# Patient Record
Sex: Male | Born: 1970
Health system: Southern US, Community
[De-identification: ages and names within clinical notes are randomized; demographics above are authoritative.]

## PROBLEM LIST (undated history)

## (undated) DIAGNOSIS — K219 Gastro-esophageal reflux disease without esophagitis: Secondary | ICD-10-CM

---

## 2002-01-15 ENCOUNTER — Encounter: Payer: Self-pay | Admitting: Emergency Medicine

## 2002-01-16 ENCOUNTER — Inpatient Hospital Stay (HOSPITAL_COMMUNITY): Admission: EM | Admit: 2002-01-16 | Discharge: 2002-01-18 | Payer: Self-pay | Admitting: Emergency Medicine

## 2002-01-17 ENCOUNTER — Encounter: Payer: Self-pay | Admitting: Emergency Medicine

## 2014-01-07 ENCOUNTER — Other Ambulatory Visit (HOSPITAL_COMMUNITY): Payer: Self-pay | Admitting: Internal Medicine

## 2014-01-07 DIAGNOSIS — R1310 Dysphagia, unspecified: Secondary | ICD-10-CM

## 2014-01-07 DIAGNOSIS — R1013 Epigastric pain: Secondary | ICD-10-CM

## 2014-01-07 DIAGNOSIS — R110 Nausea: Secondary | ICD-10-CM

## 2014-01-17 ENCOUNTER — Ambulatory Visit (HOSPITAL_COMMUNITY)
Admission: RE | Admit: 2014-01-17 | Discharge: 2014-01-17 | Disposition: A | Discharge status: Home / Self Care | Payer: Medicare Other | Source: Ambulatory Visit | Attending: Internal Medicine | Admitting: Internal Medicine

## 2014-01-17 DIAGNOSIS — R1310 Dysphagia, unspecified: Secondary | ICD-10-CM

## 2014-01-17 DIAGNOSIS — R110 Nausea: Secondary | ICD-10-CM

## 2014-01-17 DIAGNOSIS — R1013 Epigastric pain: Secondary | ICD-10-CM

## 2014-01-17 DIAGNOSIS — F458 Other somatoform disorders: Secondary | ICD-10-CM | POA: Insufficient documentation

## 2014-01-17 DIAGNOSIS — K219 Gastro-esophageal reflux disease without esophagitis: Secondary | ICD-10-CM | POA: Insufficient documentation

## 2014-01-17 DIAGNOSIS — Z87820 Personal history of traumatic brain injury: Secondary | ICD-10-CM | POA: Insufficient documentation
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
## 2016-08-27 ENCOUNTER — Other Ambulatory Visit (HOSPITAL_COMMUNITY): Payer: Self-pay | Admitting: Internal Medicine

## 2016-08-27 ENCOUNTER — Ambulatory Visit (HOSPITAL_COMMUNITY)
Admission: RE | Admit: 2016-08-27 | Discharge: 2016-08-27 | Disposition: A | Discharge status: Home / Self Care | Payer: Medicare Other | Source: Ambulatory Visit | Attending: Internal Medicine | Admitting: Internal Medicine

## 2016-08-27 DIAGNOSIS — R059 Cough, unspecified: Secondary | ICD-10-CM

## 2016-08-27 DIAGNOSIS — R05 Cough: Secondary | ICD-10-CM

## 2016-08-27 DIAGNOSIS — J42 Unspecified chronic bronchitis: Secondary | ICD-10-CM | POA: Insufficient documentation

## 2016-08-27 DIAGNOSIS — Z720 Tobacco use: Secondary | ICD-10-CM | POA: Diagnosis not present

## 2017-08-18 ENCOUNTER — Other Ambulatory Visit (HOSPITAL_COMMUNITY): Payer: Self-pay | Admitting: Internal Medicine

## 2017-08-18 ENCOUNTER — Ambulatory Visit (HOSPITAL_COMMUNITY)
Admission: RE | Admit: 2017-08-18 | Discharge: 2017-08-18 | Disposition: A | Discharge status: Home / Self Care | Payer: Medicare Other | Source: Ambulatory Visit | Attending: Internal Medicine | Admitting: Internal Medicine

## 2017-08-18 DIAGNOSIS — M25511 Pain in right shoulder: Secondary | ICD-10-CM | POA: Insufficient documentation

## 2019-04-19 IMAGING — CR DG SHOULDER 2+V*R*
3 series · 3 of 3 positions shown · non-contrast
Comparison: Chest x-ray of August 27, 2016

CLINICAL DATA: Right shoulder pain of uncertain duration. No
definite injury.

EXAM:
RIGHT SHOULDER - 2+ VIEW

[shoulder grashey]
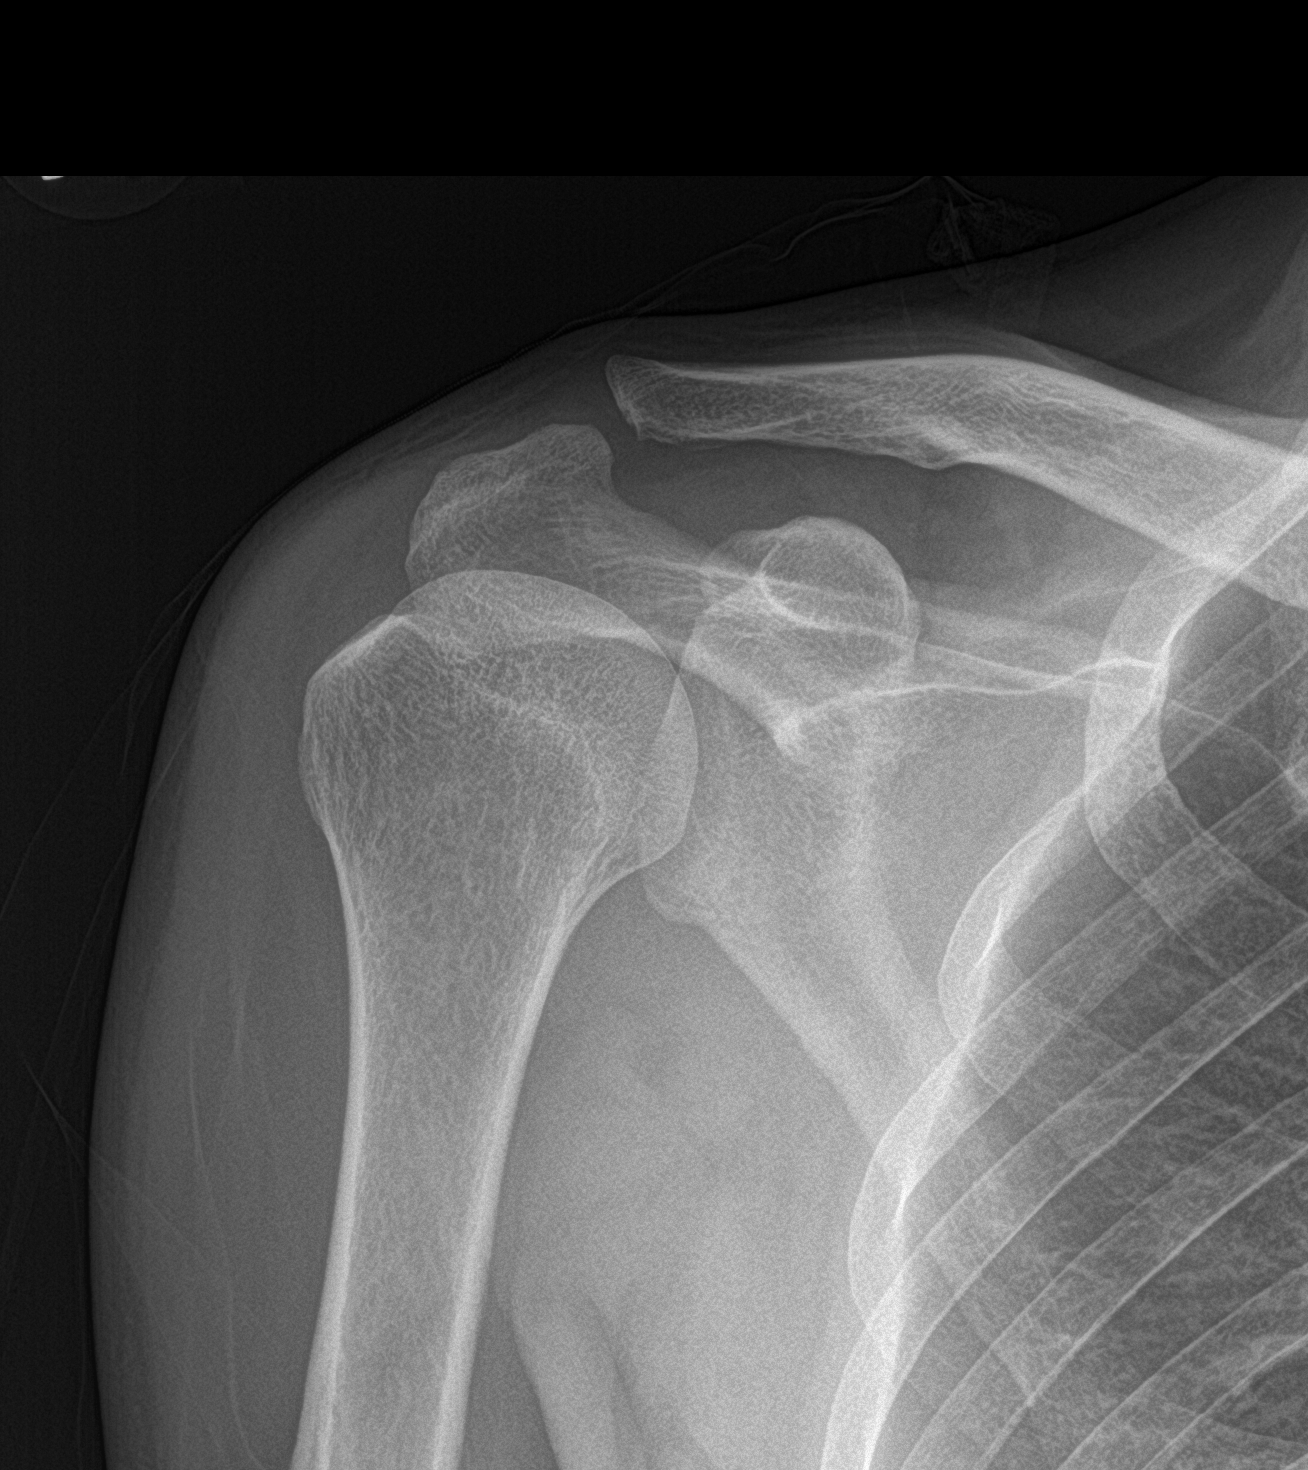

[shoulder y view]
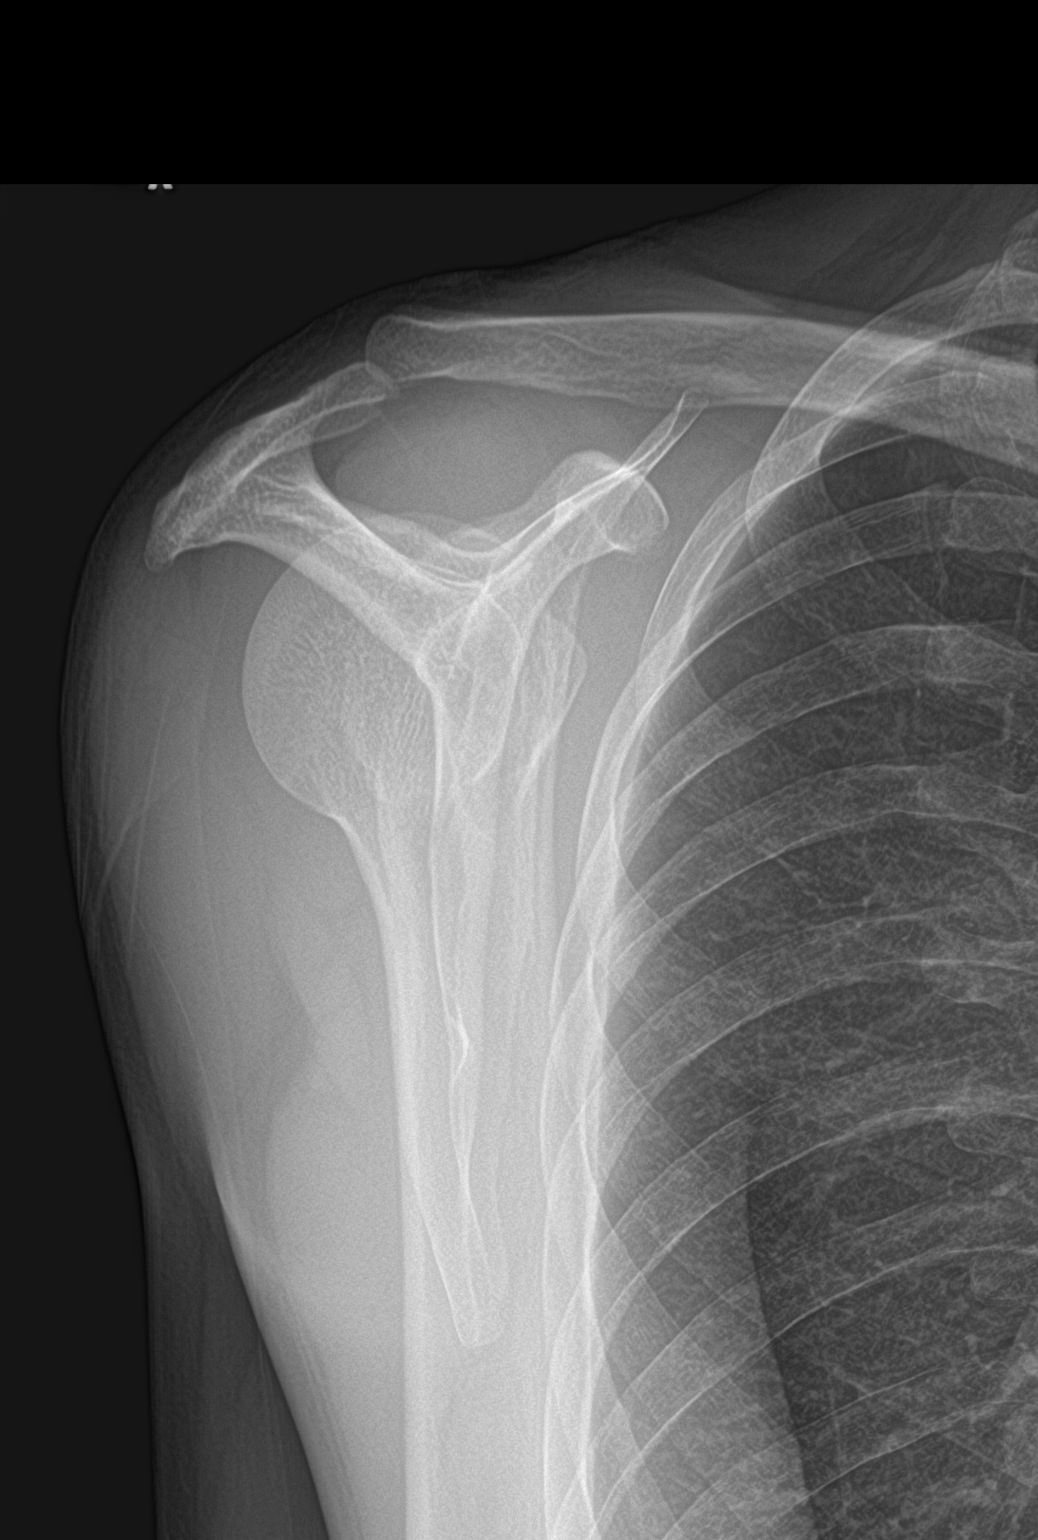

[shoulder axillary]
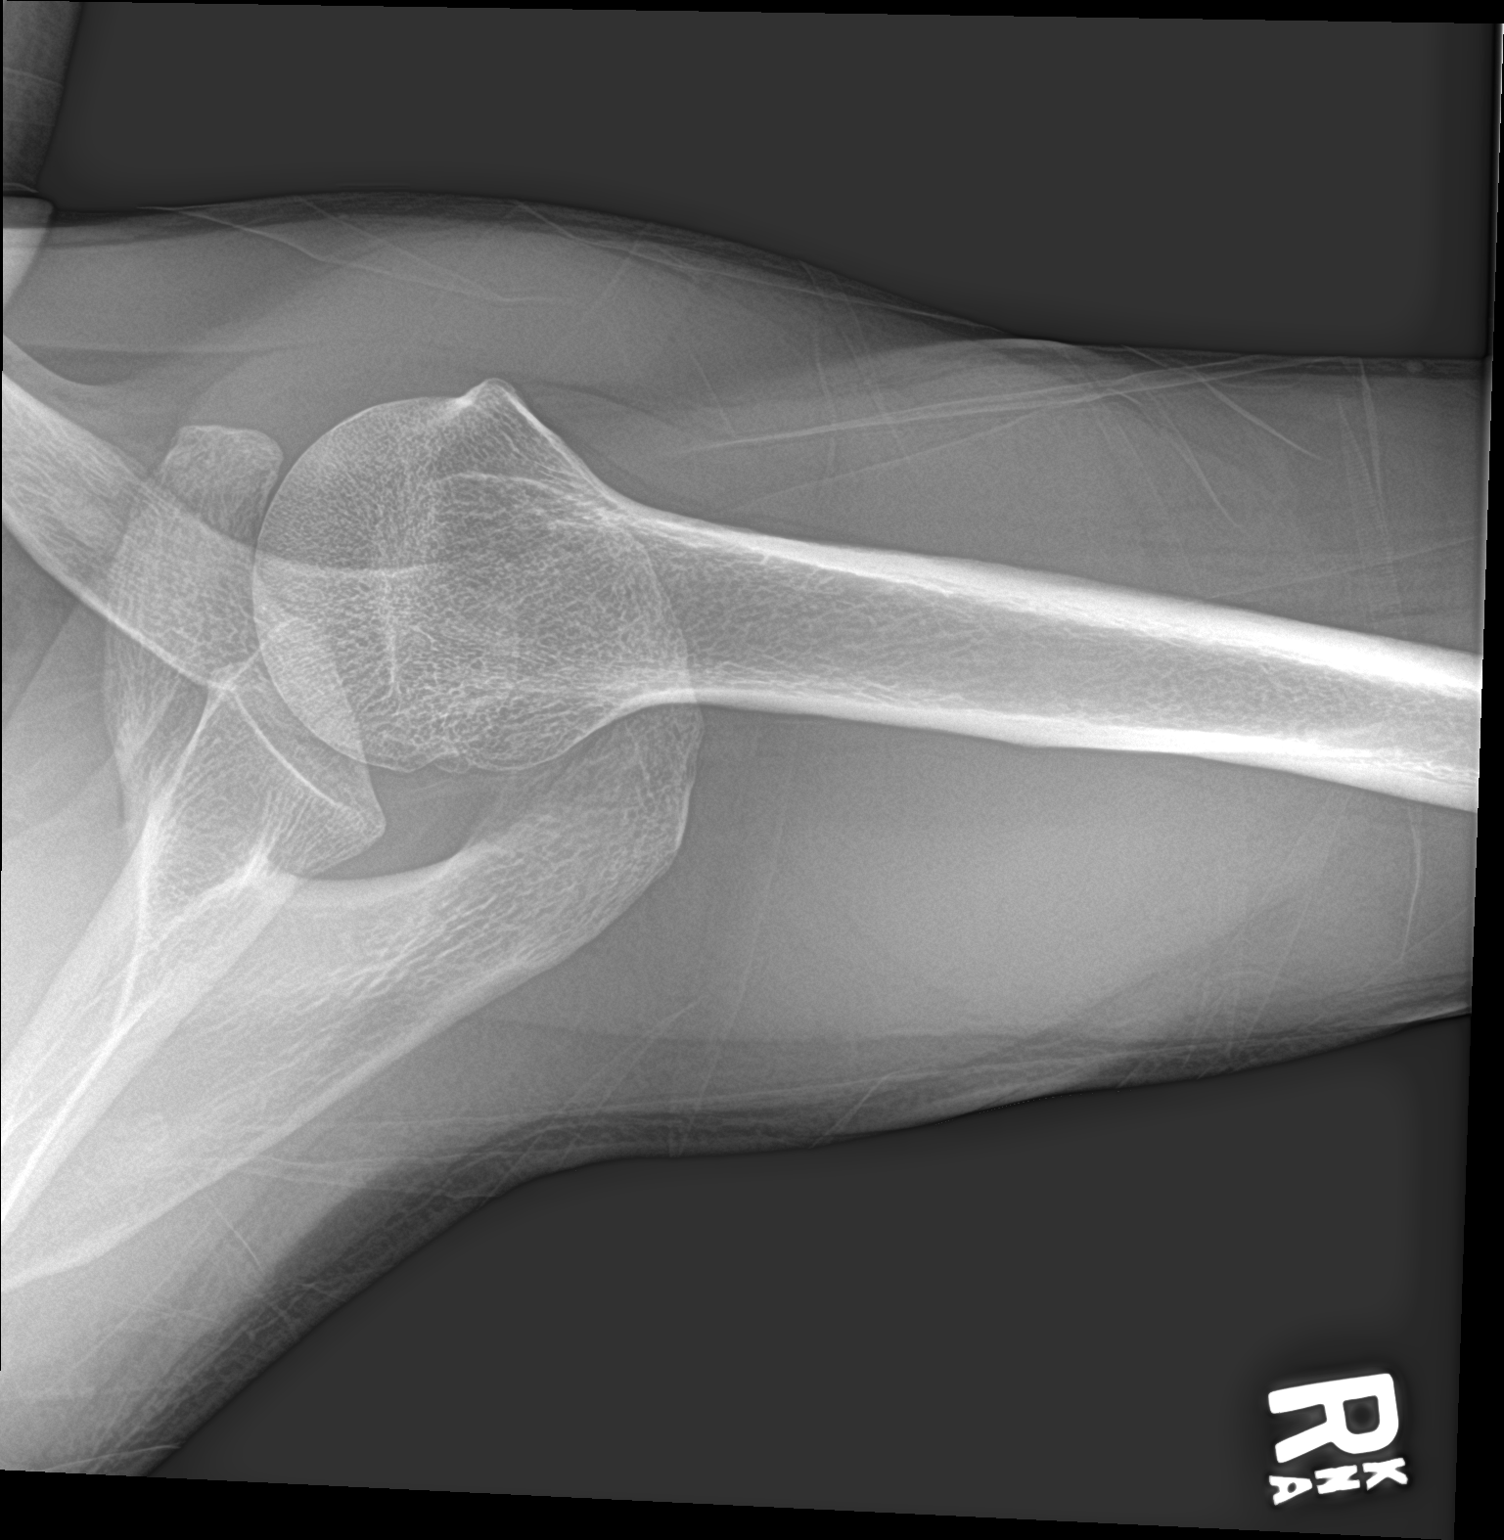

[3 of 3 positions shown; findings below may reference images not displayed]

FINDINGS: The bones are subjectively adequately mineralized. The joint spaces
are well maintained. No acute or old fracture is observed. These
soft tissues are unremarkable.
IMPRESSION: There is no acute or significant chronic bony abnormality of the
right shoulder. If there are symptoms referrable to the AC joint, an
AC joint series would be useful. Otherwise the most useful next
imaging step would be MRI.

## 2020-07-17 ENCOUNTER — Other Ambulatory Visit: Payer: Self-pay

## 2020-07-17 ENCOUNTER — Emergency Department (HOSPITAL_COMMUNITY)
Admission: EM | Admit: 2020-07-17 | Discharge: 2020-07-17 | Disposition: A | Discharge status: Home / Self Care | Payer: Medicare HMO | Attending: Emergency Medicine | Admitting: Emergency Medicine

## 2020-07-17 ENCOUNTER — Encounter (HOSPITAL_COMMUNITY): Payer: Self-pay

## 2020-07-17 DIAGNOSIS — R69 Illness, unspecified: Secondary | ICD-10-CM | POA: Diagnosis not present

## 2020-07-17 DIAGNOSIS — R21 Rash and other nonspecific skin eruption: Secondary | ICD-10-CM | POA: Diagnosis not present

## 2020-07-17 DIAGNOSIS — L720 Epidermal cyst: Secondary | ICD-10-CM | POA: Insufficient documentation

## 2020-07-17 DIAGNOSIS — F17210 Nicotine dependence, cigarettes, uncomplicated: Secondary | ICD-10-CM | POA: Diagnosis not present

## 2020-07-17 NOTE — ED Triage Notes (Signed)
Patient has a rash on his back and several hard raised areas on the back and right neck x "years."

## 2020-07-17 NOTE — ED Provider Notes (Signed)
[facility]PT Provider Note   CSN: [id number] Arrival date & time: 07/17/20  0946    History Chief Complaint  Patient presents with  . Rash    Chad Nunez is a 49 y.o. Nunez with past medical history who presents for evaluation of rash.  Patient states he has had a raised, rounded lesions to his right lateral neck as well as his posterior back times years.  States he has have never been assessed.  Area is nontender to palpation.  No overlying erythema or warmth.  Have gradually grown in size.  No pruritus.  Denies headache, lightness, dizziness, chest pain, shortness breath abdominal pain, diarrhea, dysuria.  Denies aggravating or alleviating factors.  No new lotions or perfumes.  No sensation of throat closing, oral swelling.  History obtained from patient and past medical records. Medical [place] interpreter used.  HPI     History reviewed. No pertinent past medical history.  There are no problems to display for this patient.   History reviewed. No pertinent surgical history.     Family History  Family history unknown: Yes    Social History   Tobacco Use  . Smoking status: Current Every Day Smoker    Packs/day: 0.50    Types: Cigarettes  . Smokeless tobacco: Never Used  Vaping Use  . Vaping Use: Never used  Substance Use Topics  . Alcohol use: Yes  . Drug use: Never    Home Medications Prior to Admission medications   Not on File    Allergies    Patient has no known allergies.  Review of Systems   Review of Systems  Constitutional: Negative.   HENT: Negative.   Respiratory: Negative.   Cardiovascular: Negative.   Gastrointestinal: Negative.   Genitourinary: Negative.   Musculoskeletal: Negative.   Skin: Positive for rash. Negative for color change, pallor and wound.  Neurological: Negative.   All other systems reviewed and are negative.   Physical Exam Updated Vital Signs BP 128/60 (BP Location: Left Arm)    Pulse 70   Temp 98.9 F (37.2 C) (Oral)   Resp (!) 21   Ht 5\' 3"  (1.6 m)   Wt 49.9 kg   SpO2 95%   BMI 19.49 kg/m   Physical Exam Vitals and nursing note reviewed.  Constitutional:      General: He is not in acute distress.    Appearance: He is well-developed. He is not ill-appearing, toxic-appearing or diaphoretic.  HENT:     Head: Normocephalic and atraumatic.     Mouth/Throat:     Mouth: Mucous membranes are moist.  Eyes:     Pupils: Pupils are equal, round, and reactive to light.  Neck:     Trachea: Trachea and phonation normal.      Comments: No cervical lymphadenopathy Cardiovascular:     Rate and Rhythm: Normal rate and regular rhythm.     Pulses: Normal pulses.     Heart sounds: Normal heart sounds.  Pulmonary:     Effort: Pulmonary effort is normal. No respiratory distress.     Breath sounds: Normal breath sounds.  Abdominal:     General: Bowel sounds are normal. There is no distension.     Palpations: Abdomen is soft.  Musculoskeletal:        General: Normal range of motion.     Cervical back: Full passive range of motion without pain, normal range of motion and neck supple.  Lymphadenopathy:     Cervical: No  cervical adenopathy.  Skin:    General: Skin is warm and dry.     Capillary Refill: Capillary refill takes less than 2 seconds.     Comments: Multiple firm, mobile, nontender circular lesions to posterior back ranging from 61mm to 1.4 cm in diameter as well as single 1 cm rounded area to right lateral posterior neck.  Not consistent with lymphadenopathy.  No overlying edema, erythema or warmth.  Nontender.  Areas consistent with cyst. No fluctuance or induration.  Neurological:     General: No focal deficit present.     Mental Status: He is alert and oriented to person, place, and time.     ED Results / Procedures / Treatments   Labs (all labs ordered are listed, but only abnormal results are displayed) Labs Reviewed - No data to  display  EKG None  Radiology No results found.  Procedures Procedures (including critical care time)  Medications Ordered in ED Medications - No data to display  ED Course  I have reviewed the triage vital signs and the nursing notes.  Pertinent labs & imaging results that were available during my care of the patient were reviewed by me and considered in my medical decision making (see chart for details).  Chad Nunez presents for evaluation of rash.  He is afebrile, nonseptic, non-ill-appearing.  Patient with multiple firm, mobile, nontender circular lesions consistent with cysts over his right posterior back ranging in size from 8 mm to 1.4 cm in diameter.  Patient also with number one 1 cm rounded area consistent with likely cyst to right lateral posterior neck.  No overlying edema, erythema or warmth.  No fluctuance or induration.  Symptoms have been consistent times "years."  No overlying skin changes to suggest infectious process, abscess.  Will refer outpatient for evaluation.  Patient denies any difficulty breathing or swallowing.  Pt has a patent airway without stridor and is handling secretions without difficulty; no angioedema. No blisters, no pustules, no warmth, no draining sinus tracts, no superficial abscesses, no bullous impetigo, no vesicles, no desquamation, no target lesions with dusky purpura or a central bulla. Not tender to touch. No concern for superimposed infection. No concern for SJS, TEN, TSS, tick borne illness, syphilis or other life-threatening condition.   The patient has been appropriately medically screened and/or stabilized in the ED. I have low suspicion for any other emergent medical condition which would require further screening, evaluation or treatment in the ED or require inpatient management.  Patient is hemodynamically stable and in no acute distress.  Patient able to ambulate in department prior to ED.  Evaluation does not show acute pathology that  would require ongoing or additional emergent interventions while in the emergency department or further inpatient treatment.  I have discussed the diagnosis with the patient and answered all questions.  Pain is been managed while in the emergency department and patient has no further complaints prior to discharge.  Patient is comfortable with plan discussed in room and is stable for discharge at this time.  I have discussed strict return precautions for returning to the emergency department.  Patient was encouraged to follow-up with PCP/specialist refer to at discharge.    MDM Rules/Calculators/A&P                           Final Clinical Impression(s) / ED Diagnoses Final diagnoses:  Epidermoid cyst    Rx / DC Orders ED Discharge Orders  None       Baylea Milburn A, PA-C 07/17/20 1657    Sabino Donovan, MD 07/17/20 2225

## 2020-07-17 NOTE — Discharge Instructions (Signed)
Follow up with surgery.  Return for new or worsening symptoms

## 2020-08-10 ENCOUNTER — Ambulatory Visit: Payer: Self-pay | Admitting: General Surgery

## 2020-08-10 DIAGNOSIS — L729 Follicular cyst of the skin and subcutaneous tissue, unspecified: Secondary | ICD-10-CM | POA: Diagnosis not present

## 2020-08-10 NOTE — H&P (Signed)
  History of Present Illness Chad Filler MD; 08/10/2020 10:04 AM) The patient is a 49 year old male who presents with a complaint of cyst. Patient is a 49 year old male, who comes in with his mother who is his Nurse, learning disability. The patient speaks the knees. Patient was recently seen in the ER secondary to back cyst. Patient's had previous cysts in the past. According to his mother patient has had decrease in size and itching to the cysts.     Allergies (Tanisha A. Manson Passey, RMA; 08/10/2020 9:54 AM) No Known Drug Allergies  [08/10/2020]: Allergies Reconciled   Medication History (Tanisha A. Manson Passey, RMA; 08/10/2020 9:54 AM) No Current Medications Medications Reconciled    Review of Systems Chad Filler, MD; 08/10/2020 10:6 AM) All other systems negative  Vitals (Tanisha A. Brown RMA; 08/10/2020 9:54 AM) 08/10/2020 9:54 AM Weight: 109.4 lb Height: 66in Body Surface Area: 1.55 m Body Mass Index: 17.66 kg/m  Temp.: 98.31F  Pulse: 83 (Regular)  BP: 118/82(Sitting, Left Arm, Standard)       Physical Exam Chad Filler MD; 08/10/2020 10:05 AM) The physical exam findings are as follows: Note: Constitutional: No acute distress, conversant, appears stated age  Eyes: Anicteric sclerae, moist conjunctiva, no lid lag  Neck: No thyromegaly, trachea midline, no cervical lymphadenopathy  Lungs: Clear to auscultation biilaterally, normal respiratory effot  Cardiovascular: regular rate & rhythm, no murmurs, no peripheal edema, pedal pulses 2+  GI: Soft, no masses or hepatosplenomegaly, non-tender to palpation  MSK: Normal gait, no clubbing cyanosis, edema  Skin: No rashes, palpation reveals normal skin turgor, access 3, approximately 2 cm in size, subcutaneous, right lateral neck cyst, approximately 2 cm in size, nonerythematous  Psychiatric: Appropriate judgment and insight, oriented to person, place, and time    Assessment & Plan Chad Filler MD; 08/10/2020  10:06 AM) CYST OF SKIN (L72.9) Impression: Patient is a 49 year old male with multiple back cyst 3 as well as right neck cyst.  1. We will proceed to the operating room for excision of cysts  2. I discussed with the patient the risks and benefits of the procedure to include but not limited to: Infection, bleeding, damage to structures, possible recurrence, and possible skin dehiscence. The patient was understanding and wishes to proceed.

## 2020-10-23 ENCOUNTER — Other Ambulatory Visit (HOSPITAL_COMMUNITY): Payer: Medicare Other

## 2020-10-25 ENCOUNTER — Other Ambulatory Visit: Payer: Self-pay

## 2020-10-25 ENCOUNTER — Encounter (HOSPITAL_COMMUNITY): Payer: Self-pay | Admitting: General Surgery

## 2020-10-25 NOTE — Progress Notes (Signed)
Patient speaks [place] spoke with patient's Mother Marquis Buggy for PAT information..  Used [employer], [place] # [phone].  PCP - None Cardiologist - n/a  Chest x-ray - n/a EKG - n/a Stress Test - n/a ECHO - n/a Cardiac Cath - n/a  STOP now taking any Aspirin (unless otherwise instructed by your surgeon), Aleve, Naproxen, Ibuprofen, Motrin, Advil, Goody's, BC's, all herbal medications, fish oil, and all vitamins.   Coronavirus Screening Covid test on DOS. Do you have any of the following symptoms:  Cough yes/no: No Fever (>100.33F)  yes/no: No Runny nose yes/no: No Sore throat yes/no: No Difficulty breathing/shortness of breath  yes/no: No  Have you traveled in the last 14 days and where? yes/no: No  Patient's Mother Michaiah Holsopple verbalized understanding of instructions that were given via phone.

## 2020-10-26 ENCOUNTER — Encounter (HOSPITAL_COMMUNITY)
Admission: RE | Disposition: A | Discharge status: Home / Self Care | Payer: Self-pay | Source: Home / Self Care | Attending: General Surgery

## 2020-10-26 ENCOUNTER — Ambulatory Visit (HOSPITAL_COMMUNITY)
Admission: RE | Admit: 2020-10-26 | Discharge: 2020-10-26 | Disposition: A | Discharge status: Home / Self Care | Payer: Medicare HMO | Attending: General Surgery | Admitting: General Surgery

## 2020-10-26 ENCOUNTER — Ambulatory Visit (HOSPITAL_COMMUNITY): Payer: Medicare HMO | Admitting: Anesthesiology

## 2020-10-26 ENCOUNTER — Encounter (HOSPITAL_COMMUNITY): Payer: Self-pay | Admitting: General Surgery

## 2020-10-26 DIAGNOSIS — K219 Gastro-esophageal reflux disease without esophagitis: Secondary | ICD-10-CM | POA: Diagnosis not present

## 2020-10-26 DIAGNOSIS — R69 Illness, unspecified: Secondary | ICD-10-CM | POA: Diagnosis not present

## 2020-10-26 DIAGNOSIS — L720 Epidermal cyst: Secondary | ICD-10-CM | POA: Diagnosis not present

## 2020-10-26 DIAGNOSIS — F17200 Nicotine dependence, unspecified, uncomplicated: Secondary | ICD-10-CM | POA: Diagnosis not present

## 2020-10-26 DIAGNOSIS — L729 Follicular cyst of the skin and subcutaneous tissue, unspecified: Secondary | ICD-10-CM | POA: Diagnosis not present

## 2020-10-26 DIAGNOSIS — Z20822 Contact with and (suspected) exposure to covid-19: Secondary | ICD-10-CM | POA: Insufficient documentation

## 2020-10-26 HISTORY — PX: CYST EXCISION: SHX5701

## 2020-10-26 HISTORY — DX: Gastro-esophageal reflux disease without esophagitis: K21.9

## 2020-10-26 LAB — SARS CORONAVIRUS 2 BY RT PCR (HOSPITAL ORDER, PERFORMED IN ~~LOC~~ HOSPITAL LAB): SARS Coronavirus 2: NEGATIVE

## 2020-10-26 SURGERY — CYST REMOVAL
Anesthesia: General

## 2020-10-26 MED ORDER — OXYCODONE HCL 5 MG/5ML PO SOLN: 5.0000 mg | Freq: Once | ORAL | Status: DC | PRN

## 2020-10-26 MED ORDER — PROPOFOL 10 MG/ML IV BOLUS
INTRAVENOUS | Status: AC
  Filled 2020-10-26: qty 20

## 2020-10-26 MED ORDER — ONDANSETRON HCL 4 MG/2ML IJ SOLN
INTRAMUSCULAR | Status: DC | PRN
  Administered 2020-10-26: 4 mg via INTRAVENOUS

## 2020-10-26 MED ORDER — SUGAMMADEX SODIUM 200 MG/2ML IV SOLN
INTRAVENOUS | Status: DC | PRN
  Administered 2020-10-26: 200 mg via INTRAVENOUS

## 2020-10-26 MED ORDER — 0.9 % SODIUM CHLORIDE (POUR BTL) OPTIME
TOPICAL | Status: DC | PRN
  Administered 2020-10-26: 1000 mL

## 2020-10-26 MED ORDER — ACETAMINOPHEN 160 MG/5ML PO SOLN: 1000.0000 mg | Freq: Once | ORAL | Status: DC | PRN

## 2020-10-26 MED ORDER — ENSURE PRE-SURGERY PO LIQD: 296.0000 mL | Freq: Once | ORAL | Status: DC

## 2020-10-26 MED ORDER — DEXAMETHASONE SODIUM PHOSPHATE 10 MG/ML IJ SOLN
INTRAMUSCULAR | Status: DC | PRN
  Administered 2020-10-26: 10 mg via INTRAVENOUS

## 2020-10-26 MED ORDER — ORAL CARE MOUTH RINSE: 15.0000 mL | Freq: Once | OROMUCOSAL | Status: AC

## 2020-10-26 MED ORDER — ROCURONIUM BROMIDE 10 MG/ML (PF) SYRINGE
PREFILLED_SYRINGE | INTRAVENOUS | Status: DC | PRN
  Administered 2020-10-26: 40 mg via INTRAVENOUS

## 2020-10-26 MED ORDER — CEFAZOLIN SODIUM-DEXTROSE 2-4 GM/100ML-% IV SOLN
2.0000 g | INTRAVENOUS | Status: AC
  Administered 2020-10-26: 2 g via INTRAVENOUS
  Filled 2020-10-26: qty 100

## 2020-10-26 MED ORDER — CHLORHEXIDINE GLUCONATE CLOTH 2 % EX PADS: 6.0000 | MEDICATED_PAD | Freq: Once | CUTANEOUS | Status: DC

## 2020-10-26 MED ORDER — OXYCODONE HCL 5 MG PO TABS: 5.0000 mg | ORAL_TABLET | Freq: Once | ORAL | Status: DC | PRN

## 2020-10-26 MED ORDER — PROPOFOL 10 MG/ML IV BOLUS
INTRAVENOUS | Status: DC | PRN
  Administered 2020-10-26: 20 mg via INTRAVENOUS
  Administered 2020-10-26: 30 mg via INTRAVENOUS
  Administered 2020-10-26: 90 mg via INTRAVENOUS

## 2020-10-26 MED ORDER — TRAMADOL HCL 50 MG PO TABS
50.0000 mg | ORAL_TABLET | Freq: Four times a day (QID) | ORAL | 0 refills | Status: AC | PRN
Start: 2020-10-26 — End: 2021-10-26

## 2020-10-26 MED ORDER — ACETAMINOPHEN 500 MG PO TABS
1000.0000 mg | ORAL_TABLET | ORAL | Status: AC
  Administered 2020-10-26: 1000 mg via ORAL
  Filled 2020-10-26: qty 2

## 2020-10-26 MED ORDER — LIDOCAINE 2% (20 MG/ML) 5 ML SYRINGE
INTRAMUSCULAR | Status: DC | PRN
  Administered 2020-10-26: 40 mg via INTRAVENOUS

## 2020-10-26 MED ORDER — FENTANYL CITRATE (PF) 250 MCG/5ML IJ SOLN
INTRAMUSCULAR | Status: DC | PRN
  Administered 2020-10-26: 100 ug via INTRAVENOUS

## 2020-10-26 MED ORDER — ACETAMINOPHEN 10 MG/ML IV SOLN: 1000.0000 mg | Freq: Once | INTRAVENOUS | Status: DC | PRN

## 2020-10-26 MED ORDER — FENTANYL CITRATE (PF) 100 MCG/2ML IJ SOLN: 25.0000 ug | INTRAMUSCULAR | Status: DC | PRN

## 2020-10-26 MED ORDER — CHLORHEXIDINE GLUCONATE 0.12 % MT SOLN
15.0000 mL | Freq: Once | OROMUCOSAL | Status: AC
  Administered 2020-10-26: 15 mL via OROMUCOSAL
  Filled 2020-10-26: qty 15

## 2020-10-26 MED ORDER — BUPIVACAINE HCL (PF) 0.25 % IJ SOLN: INTRAMUSCULAR | Status: DC | PRN

## 2020-10-26 MED ORDER — LACTATED RINGERS IV SOLN: INTRAVENOUS | Status: DC | PRN

## 2020-10-26 MED ORDER — FENTANYL CITRATE (PF) 250 MCG/5ML IJ SOLN
INTRAMUSCULAR | Status: AC
  Filled 2020-10-26: qty 5

## 2020-10-26 MED ORDER — MIDAZOLAM HCL 2 MG/2ML IJ SOLN
INTRAMUSCULAR | Status: AC
  Filled 2020-10-26: qty 2

## 2020-10-26 MED ORDER — ACETAMINOPHEN 500 MG PO TABS: 1000.0000 mg | ORAL_TABLET | Freq: Once | ORAL | Status: DC | PRN

## 2020-10-26 MED ORDER — BUPIVACAINE HCL (PF) 0.25 % IJ SOLN
INTRAMUSCULAR | Status: AC
  Filled 2020-10-26: qty 30

## 2020-10-26 SURGICAL SUPPLY — 33 items
ADH SKN CLS APL DERMABOND .7 (GAUZE/BANDAGES/DRESSINGS) ×1
APL PRP STRL LF DISP 70% ISPRP (MISCELLANEOUS) ×1
CANISTER SUCT 3000ML PPV (MISCELLANEOUS) IMPLANT
CHLORAPREP W/TINT 26 (MISCELLANEOUS) ×3 IMPLANT
COVER SURGICAL LIGHT HANDLE (MISCELLANEOUS) ×3 IMPLANT
DERMABOND ADVANCED (GAUZE/BANDAGES/DRESSINGS) ×2
DERMABOND ADVANCED .7 DNX12 (GAUZE/BANDAGES/DRESSINGS) ×1 IMPLANT
DRAPE ORTHO SPLIT 77X108 STRL (DRAPES) ×6
DRAPE SURG ORHT 6 SPLT 77X108 (DRAPES) IMPLANT
ELECT REM PT RETURN 9FT ADLT (ELECTROSURGICAL) ×3
ELECTRODE REM PT RTRN 9FT ADLT (ELECTROSURGICAL) ×1 IMPLANT
GLOVE BIO SURGEON STRL SZ7.5 (GLOVE) ×3 IMPLANT
GLOVE BIOGEL PI IND STRL 8 (GLOVE) ×1 IMPLANT
GLOVE BIOGEL PI INDICATOR 8 (GLOVE) ×2
GOWN STRL REUS W/ TWL LRG LVL3 (GOWN DISPOSABLE) ×1 IMPLANT
GOWN STRL REUS W/ TWL XL LVL3 (GOWN DISPOSABLE) ×1 IMPLANT
GOWN STRL REUS W/TWL LRG LVL3 (GOWN DISPOSABLE) ×3
GOWN STRL REUS W/TWL XL LVL3 (GOWN DISPOSABLE) ×3
KIT BASIN OR (CUSTOM PROCEDURE TRAY) ×3 IMPLANT
KIT TURNOVER KIT B (KITS) ×3 IMPLANT
NDL HYPO 25GX1X1/2 BEV (NEEDLE) ×1 IMPLANT
NEEDLE HYPO 25GX1X1/2 BEV (NEEDLE) ×3 IMPLANT
NS IRRIG 1000ML POUR BTL (IV SOLUTION) ×3 IMPLANT
PACK GENERAL/GYN (CUSTOM PROCEDURE TRAY) ×3 IMPLANT
PAD ARMBOARD 7.5X6 YLW CONV (MISCELLANEOUS) ×3 IMPLANT
PENCIL SMOKE EVACUATOR (MISCELLANEOUS) ×3 IMPLANT
SUT MNCRL AB 4-0 PS2 18 (SUTURE) ×5 IMPLANT
SUT VIC AB 3-0 SH 27 (SUTURE) ×3
SUT VIC AB 3-0 SH 27XBRD (SUTURE) ×1 IMPLANT
SUT VIC AB 3-0 SH 8-18 (SUTURE) ×4 IMPLANT
SYR CONTROL 10ML LL (SYRINGE) ×3 IMPLANT
TOWEL GREEN STERILE (TOWEL DISPOSABLE) ×3 IMPLANT
TOWEL GREEN STERILE FF (TOWEL DISPOSABLE) ×3 IMPLANT

## 2020-10-26 NOTE — Progress Notes (Signed)
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 Tona Sensing for [employer].

## 2020-10-26 NOTE — H&P (Signed)
  History of Present Illness  The patient is a 49 year old male who presents with a complaint of cyst. Patient is a 49 year old male, who comes in with his mother who is his Nurse, learning disability. The patient speaks the knees. Patient was recently seen in the ER secondary to back cyst. Patient's had previous cysts in the past. According to his mother patient has had decrease in size and itching to the cysts.     Allergies  No Known Drug Allergies  [08/10/2020]: Allergies Reconciled   Medication History No Current Medications Medications Reconciled    Review of Systems  All other systems negative  BP (!) 151/95   Pulse 65   Temp 98.2 F (36.8 C) (Temporal)   Resp 18   Ht 5\' 3"  (1.6 m)   Wt 49.1 kg   SpO2 99%   BMI 19.18 kg/m       Physical Exam The physical exam findings are as follows: Note: Constitutional: No acute distress, conversant, appears stated age  Eyes: Anicteric sclerae, moist conjunctiva, no lid lag  Neck: No thyromegaly, trachea midline, no cervical lymphadenopathy  Lungs: Clear to auscultation biilaterally, normal respiratory effot  Cardiovascular: regular rate & rhythm, no murmurs, no peripheal edema, pedal pulses 2+  GI: Soft, no masses or hepatosplenomegaly, non-tender to palpation  MSK: Normal gait, no clubbing cyanosis, edema  Skin: No rashes, palpation reveals normal skin turgor, access 3, approximately 2 cm in size, subcutaneous, right lateral neck cyst, approximately 2 cm in size, nonerythematous  Psychiatric: Appropriate judgment and insight, oriented to person, place, and time    Assessment & Plan  CYST OF SKIN (L72.9) Impression: Patient is a 49 year old male with multiple back cyst 3 as well as right neck cyst.  1. We will proceed to the operating room for excision of cysts  2. I discussed with the patient the risks and benefits of the procedure to include but not limited to: Infection, bleeding, damage  to structures, possible recurrence, and possible skin dehiscence. The patient was understanding and wishes to proceed.

## 2020-10-26 NOTE — Discharge Instructions (Signed)
Epidermal Cyst Removal, Care After This sheet gives you information about how to care for yourself after your procedure. Your health care provider may also give you more specific instructions. If you have problems or questions, contact your health care provider. What can I expect after the procedure? After the procedure, it is common to have:  Soreness in the area where your cyst was removed.  Tightness or itchiness from the stitches (sutures) in your skin. Follow these instructions at home: Medicines  Take over-the-counter and prescription medicines only as told by your health care provider.  If you were prescribed an antibiotic medicine or ointment, take or apply it as told by your health care provider. Do not stop using the antibiotic even if you start to feel better. Incision care   Follow instructions from your health care provider about how to take care of your incision. Make sure you: ? Wash your hands with soap and water before you change your bandage (dressing). If soap and water are not available, use hand sanitizer. ? Change your dressing as told by your health care provider. ? Leave sutures, skin glue, or adhesive strips in place. These skin closures may need to stay in place for 1-2 weeks or longer. If adhesive strip edges start to loosen and curl up, you may trim the loose edges. Do not remove adhesive strips completely unless your health care provider tells you to do that.  Keep the dressingdry until your health care provider says that it can be removed.  After your dressing is off, check your incision area every day for signs of infection. Check for: ? Redness, swelling, or pain. ? Fluid or blood. ? Warmth. ? Pus or a bad smell. General instructions  Do not take baths, swim, or use a hot tub until your health care provider approves. Ask your health care provider if you may take showers. You may only be allowed to take sponge baths.  Your health care provider may ask  you to avoid contact sports or activities that take a lot of effort. Do not do anything that stretches or puts pressure on your incision.  You can return to your normal diet.  Keep all follow-up visits as told by your health care provider. This is important. Contact a health care provider if:  You have a fever.  You have redness, swelling, or pain in the incision area.  You have fluid or blood coming from your incision.  You have pus or a bad smell coming from your incision.  Your incision feels warm to the touch.  Your cyst grows back. Summary  After the procedure, it is common to have soreness in the area where your cyst was removed.  Take or apply over-the-counter and prescription medicines only as told by your health care provider.  Follow instructions from your health care provider about how to take care of your incision. This information is not intended to replace advice given to you by your health care provider. Make sure you discuss any questions you have with your health care provider. Document Revised: 04/07/2018 Document Reviewed: 10/09/2017 Elsevier Patient Education  2020 Elsevier Inc.  

## 2020-10-26 NOTE — Op Note (Signed)
**Note Chad-Identified via Obfuscation** 10/26/2020  8:22 AM  PATIENT:  Chad [facility]z  49 y.o. male  PRE-OPERATIVE DIAGNOSIS:  BACK CYST X3 AND NECK CYST  POST-OPERATIVE DIAGNOSIS:  BACK CYST X3 AND NECK CYST  PROCEDURE:  Procedure(s): EXCISION OF BACK CYST X3 (3x3cm, 2x3cm, 3x3cm) AND  NECK CYST (2x3cm) (N/A)  SURGEON:  Surgeon(s) and Role:    [name], MD - Primary  ANESTHESIA:   local and general  EBL:  10 mL   BLOOD ADMINISTERED:none  DRAINS: none   LOCAL MEDICATIONS USED:  none  SPECIMEN:  Source of Specimen:  Back and neck cyst as above  DISPOSITION OF SPECIMEN:  PATHOLOGY  COUNTS:  YES  TOURNIQUET:  * No tourniquets in log *  DICTATION: .Dragon Dictation  After the patient was consented she was taken to OR and placed in supine position with bilateral SCDs in place. She underwent general anesthesia. He was placed in the prone position. Patient was prepped and draped in standard fashion. Timeout was called all facts verified.   An elliptical incision was made over the area of each cyst. This was dissected down sharply and circumferentially down to healthy fat tissue. This was excised.  The back cysts measured 3x3cm, 2x3cm, 3x3cm.  The neck cyst was incised in an elliptical shape.  This was dissected down sharply and circumferentially down to healthy fat tissue. This was excised.  The neck cysts measured 2x3cm.  These were sent  to pathology. Cautery was used to maintain hemostasis. At this time 3-0 Vicryl used to reapproximate the deep dermal layers. 4-0 Monocryl was used to reapproximate the skin in a subcuticular fashion. The skin was dressed with Dermabond. The patient tied the procedure well was taken to recovery room stable condition.   PLAN OF CARE: Discharge to home after PACU  PATIENT DISPOSITION:  PACU - hemodynamically stable.   Delay start of Pharmacological VTE agent (>24hrs) due to surgical blood loss or risk of bleeding: not applicable

## 2020-10-26 NOTE — Progress Notes (Signed)
Called Dr Jairo Ben inquiring about labs for surgery.  Reviewed patient's medical history and medication.  Per Dr Jean Rosenthal, no labs needed for surgery this morning.

## 2020-10-26 NOTE — Anesthesia Procedure Notes (Signed)
Procedure Name: Intubation Date/Time: 10/26/2020 7:38 AM Performed by: Thelma Comp, CRNA Pre-anesthesia Checklist: Patient identified, Emergency Drugs available, Suction available and Patient being monitored Patient Re-evaluated:Patient Re-evaluated prior to induction Oxygen Delivery Method: Circle System Utilized Preoxygenation: Pre-oxygenation with 100% oxygen Induction Type: IV induction Ventilation: Mask ventilation without difficulty Laryngoscope Size: Mac and 3 Grade View: Grade I Tube type: Oral Tube size: 7.5 mm Number of attempts: 1 Airway Equipment and Method: Stylet and Oral airway Placement Confirmation: ETT inserted through vocal cords under direct vision,  positive ETCO2 and breath sounds checked- equal and bilateral Secured at: 23 cm Tube secured with: Tape Dental Injury: Teeth and Oropharynx as per pre-operative assessment

## 2020-10-26 NOTE — Transfer of Care (Signed)
Immediate Anesthesia Transfer of Care Note  Patient: Chad Nunez  Procedure(s) Performed: EXCISION OF BACK CYST X3 AND  NECK CYST (N/A )  Patient Location: PACU  Anesthesia Type:General  Level of Consciousness: drowsy and patient cooperative  Airway & Oxygen Therapy: Patient Spontanous Breathing  Post-op Assessment: Report given to RN, Post -op Vital signs reviewed and stable and Patient moving all extremities X 4  Post vital signs: Reviewed and stable  Last Vitals:  Vitals Value Taken Time  BP 151/88 10/26/20 0835  Temp    Pulse 69 10/26/20 0834  Resp 15 10/26/20 0836  SpO2 100 % 10/26/20 0834  Vitals shown include unvalidated device data.  Last Pain:  Vitals:   10/26/20 0630  TempSrc:   PainSc: 0-No pain      Patients Stated Pain Goal: 3 (10/26/20 0630)  Complications: No complications documented.

## 2020-10-26 NOTE — Anesthesia Preprocedure Evaluation (Addendum)
Anesthesia Evaluation  Patient identified by MRN, date of birth, ID band Patient awake    Reviewed: Allergy & Precautions, NPO status , Patient's Chart, lab work & pertinent test results  Airway Mallampati: II  TM Distance: >3 FB Neck ROM: Full    Dental  (+) Dental Advisory Given   Pulmonary neg shortness of breath, neg COPD, neg recent URI, Current Smoker and Patient abstained from smoking.,    breath sounds clear to auscultation       Cardiovascular negative cardio ROS   Rhythm:Regular     Neuro/Psych negative neurological ROS  negative psych ROS   GI/Hepatic Neg liver ROS, GERD  Medicated and Controlled,  Endo/Other  negative endocrine ROS  Renal/GU negative Renal ROS     Musculoskeletal negative musculoskeletal ROS (+)   Abdominal   Peds  Hematology negative hematology ROS (+)   Anesthesia Other Findings   Reproductive/Obstetrics                            Anesthesia Physical Anesthesia Plan  ASA: II  Anesthesia Plan: General   Post-op Pain Management:    Induction: Intravenous  PONV Risk Score and Plan: 1 and Ondansetron and Dexamethasone  Airway Management Planned: Oral ETT  Additional Equipment: None  Intra-op Plan:   Post-operative Plan: Extubation in OR  Informed Consent: I have reviewed the patients History and Physical, chart, labs and discussed the procedure including the risks, benefits and alternatives for the proposed anesthesia with the patient or authorized representative who has indicated his/her understanding and acceptance.     Dental advisory given  Plan Discussed with: CRNA and Surgeon  Anesthesia Plan Comments:         Anesthesia Quick Evaluation

## 2020-10-27 ENCOUNTER — Encounter (HOSPITAL_COMMUNITY): Payer: Self-pay | Admitting: General Surgery

## 2020-10-27 LAB — SURGICAL PATHOLOGY

## 2020-11-06 NOTE — Anesthesia Postprocedure Evaluation (Signed)
Anesthesia Post Note  Patient: Chad Nunez  Procedure(s) Performed: EXCISION OF BACK CYST X3 AND  NECK CYST (N/A )     Patient location during evaluation: PACU Anesthesia Type: General Level of consciousness: awake and alert Pain management: pain level controlled Vital Signs Assessment: post-procedure vital signs reviewed and stable Respiratory status: spontaneous breathing, nonlabored ventilation, respiratory function stable and patient connected to nasal cannula oxygen Cardiovascular status: blood pressure returned to baseline and stable Postop Assessment: no apparent nausea or vomiting Anesthetic complications: no   No complications documented.  Last Vitals:  Vitals:   10/26/20 0850 10/26/20 0901  BP: (!) 140/94 (!) 160/99  Pulse: 67 (!) 56  Resp: 13 16  Temp:  36.5 C  SpO2: 99% 100%    Last Pain:  Vitals:   10/26/20 0901  TempSrc:   PainSc: 0-No pain                 Chyane Greer

## 2020-12-20 DIAGNOSIS — W1789XA Other fall from one level to another, initial encounter: Secondary | ICD-10-CM | POA: Diagnosis not present

## 2020-12-20 DIAGNOSIS — T1490XA Injury, unspecified, initial encounter: Secondary | ICD-10-CM | POA: Diagnosis not present

## 2020-12-20 DIAGNOSIS — Y907 Blood alcohol level of 200-239 mg/100 ml: Secondary | ICD-10-CM | POA: Diagnosis not present

## 2020-12-20 DIAGNOSIS — S0012XA Contusion of left eyelid and periocular area, initial encounter: Secondary | ICD-10-CM | POA: Diagnosis not present

## 2020-12-20 DIAGNOSIS — R69 Illness, unspecified: Secondary | ICD-10-CM | POA: Diagnosis not present

## 2020-12-20 DIAGNOSIS — H5789 Other specified disorders of eye and adnexa: Secondary | ICD-10-CM | POA: Diagnosis not present

## 2020-12-20 DIAGNOSIS — S0285XA Fracture of orbit, unspecified, initial encounter for closed fracture: Secondary | ICD-10-CM | POA: Diagnosis not present

## 2020-12-20 DIAGNOSIS — S0990XA Unspecified injury of head, initial encounter: Secondary | ICD-10-CM | POA: Diagnosis not present

## 2020-12-20 DIAGNOSIS — H0520 Unspecified exophthalmos: Secondary | ICD-10-CM | POA: Diagnosis not present

## 2020-12-20 DIAGNOSIS — G9340 Encephalopathy, unspecified: Secondary | ICD-10-CM | POA: Diagnosis not present

## 2020-12-20 DIAGNOSIS — S0219XA Other fracture of base of skull, initial encounter for closed fracture: Secondary | ICD-10-CM | POA: Diagnosis not present
